# Patient Record
Sex: Female | Born: 1980 | Race: White | Hispanic: No | Marital: Single | Smoking: Current every day smoker
Health system: Southern US, Community
[De-identification: ages and names within clinical notes are randomized; demographics above are authoritative.]

## PROBLEM LIST (undated history)

## (undated) DIAGNOSIS — C801 Malignant (primary) neoplasm, unspecified: Secondary | ICD-10-CM

## (undated) DIAGNOSIS — Z905 Acquired absence of kidney: Secondary | ICD-10-CM

## (undated) DIAGNOSIS — N200 Calculus of kidney: Secondary | ICD-10-CM

## (undated) DIAGNOSIS — I10 Essential (primary) hypertension: Secondary | ICD-10-CM

## (undated) DIAGNOSIS — G43909 Migraine, unspecified, not intractable, without status migrainosus: Secondary | ICD-10-CM

## (undated) HISTORY — PX: NEPHRECTOMY: SHX65

## (undated) HISTORY — PX: LYMPH GLAND EXCISION: SHX13

---

## 2013-07-16 ENCOUNTER — Inpatient Hospital Stay (HOSPITAL_COMMUNITY): Payer: Self-pay

## 2013-07-16 ENCOUNTER — Emergency Department (HOSPITAL_COMMUNITY): Payer: Self-pay

## 2013-07-16 ENCOUNTER — Inpatient Hospital Stay (HOSPITAL_COMMUNITY)
Admission: EM | Admit: 2013-07-16 | Discharge: 2013-07-17 | DRG: 093 | Disposition: A | Discharge status: Home / Self Care | Payer: Self-pay | Attending: Neurology | Admitting: Neurology

## 2013-07-16 ENCOUNTER — Encounter (HOSPITAL_COMMUNITY): Payer: Self-pay | Admitting: Emergency Medicine

## 2013-07-16 ENCOUNTER — Emergency Department (HOSPITAL_COMMUNITY): Payer: MEDICAID

## 2013-07-16 DIAGNOSIS — I671 Cerebral aneurysm, nonruptured: Principal | ICD-10-CM | POA: Diagnosis present

## 2013-07-16 DIAGNOSIS — F17200 Nicotine dependence, unspecified, uncomplicated: Secondary | ICD-10-CM | POA: Diagnosis present

## 2013-07-16 DIAGNOSIS — I10 Essential (primary) hypertension: Secondary | ICD-10-CM | POA: Diagnosis present

## 2013-07-16 DIAGNOSIS — H02409 Unspecified ptosis of unspecified eyelid: Secondary | ICD-10-CM | POA: Diagnosis present

## 2013-07-16 DIAGNOSIS — R4789 Other speech disturbances: Secondary | ICD-10-CM | POA: Diagnosis present

## 2013-07-16 DIAGNOSIS — M6281 Muscle weakness (generalized): Secondary | ICD-10-CM

## 2013-07-16 DIAGNOSIS — I639 Cerebral infarction, unspecified: Secondary | ICD-10-CM | POA: Diagnosis present

## 2013-07-16 DIAGNOSIS — Z85528 Personal history of other malignant neoplasm of kidney: Secondary | ICD-10-CM

## 2013-07-16 DIAGNOSIS — R29898 Other symptoms and signs involving the musculoskeletal system: Secondary | ICD-10-CM | POA: Diagnosis present

## 2013-07-16 DIAGNOSIS — R55 Syncope and collapse: Secondary | ICD-10-CM

## 2013-07-16 DIAGNOSIS — R531 Weakness: Secondary | ICD-10-CM | POA: Diagnosis present

## 2013-07-16 DIAGNOSIS — E876 Hypokalemia: Secondary | ICD-10-CM | POA: Diagnosis present

## 2013-07-16 DIAGNOSIS — I6350 Cerebral infarction due to unspecified occlusion or stenosis of unspecified cerebral artery: Secondary | ICD-10-CM

## 2013-07-16 HISTORY — DX: Calculus of kidney: N20.0

## 2013-07-16 HISTORY — DX: Migraine, unspecified, not intractable, without status migrainosus: G43.909

## 2013-07-16 HISTORY — DX: Essential (primary) hypertension: I10

## 2013-07-16 HISTORY — DX: Malignant (primary) neoplasm, unspecified: C80.1

## 2013-07-16 HISTORY — DX: Acquired absence of kidney: Z90.5

## 2013-07-16 LAB — I-STAT CHEM 8, ED
BUN: 10 mg/dL (ref 6–23)
CREATININE: 0.7 mg/dL (ref 0.50–1.10)
Calcium, Ion: 1.22 mmol/L (ref 1.12–1.23)
Chloride: 104 mEq/L (ref 96–112)
Glucose, Bld: 75 mg/dL (ref 70–99)
HCT: 38 % (ref 36.0–46.0)
Hemoglobin: 12.9 g/dL (ref 12.0–15.0)
Potassium: 3.2 mEq/L — ABNORMAL LOW (ref 3.7–5.3)
Sodium: 143 mEq/L (ref 137–147)
TCO2: 22 mmol/L (ref 0–100)

## 2013-07-16 LAB — COMPREHENSIVE METABOLIC PANEL
ALBUMIN: 3.5 g/dL (ref 3.5–5.2)
ALK PHOS: 56 U/L (ref 39–117)
ALT: 7 U/L (ref 0–35)
AST: 11 U/L (ref 0–37)
BUN: 12 mg/dL (ref 6–23)
CALCIUM: 8.9 mg/dL (ref 8.4–10.5)
CO2: 23 mEq/L (ref 19–32)
Chloride: 106 mEq/L (ref 96–112)
Creatinine, Ser: 0.71 mg/dL (ref 0.50–1.10)
GFR calc non Af Amer: >90 mL/min (ref >90)
GLUCOSE: 75 mg/dL (ref 70–99)
POTASSIUM: 3.4 meq/L — AB (ref 3.7–5.3)
SODIUM: 142 meq/L (ref 137–147)
TOTAL PROTEIN: 6.1 g/dL (ref 6.0–8.3)
Total Bilirubin: 0.3 mg/dL (ref 0.3–1.2)

## 2013-07-16 LAB — CBC
HEMATOCRIT: 37 % (ref 36.0–46.0)
Hemoglobin: 12.4 g/dL (ref 12.0–15.0)
MCH: 31 pg (ref 26.0–34.0)
MCHC: 33.5 g/dL (ref 30.0–36.0)
MCV: 92.5 fL (ref 78.0–100.0)
PLATELETS: 231 10*3/uL (ref 150–400)
RBC: 4 MIL/uL (ref 3.87–5.11)
RDW: 12.6 % (ref 11.5–15.5)
WBC: 7.4 10*3/uL (ref 4.0–10.5)

## 2013-07-16 LAB — APTT: APTT: 38 s — AB (ref 24–37)

## 2013-07-16 LAB — URINALYSIS, ROUTINE W REFLEX MICROSCOPIC
Bilirubin Urine: NEGATIVE
GLUCOSE, UA: NEGATIVE mg/dL
KETONES UR: NEGATIVE mg/dL
Leukocytes, UA: NEGATIVE
Nitrite: NEGATIVE
PROTEIN: NEGATIVE mg/dL
Specific Gravity, Urine: 1.014 (ref 1.005–1.030)
UROBILINOGEN UA: 0.2 mg/dL (ref 0.0–1.0)
pH: 7.5 (ref 5.0–8.0)

## 2013-07-16 LAB — MRSA PCR SCREENING: MRSA by PCR: NEGATIVE

## 2013-07-16 LAB — DIFFERENTIAL
BASOS PCT: 0 % (ref 0–1)
Basophils Absolute: 0 10*3/uL (ref 0.0–0.1)
EOS ABS: 0.1 10*3/uL (ref 0.0–0.7)
EOS PCT: 2 % (ref 0–5)
Lymphocytes Relative: 33 % (ref 12–46)
Lymphs Abs: 2.5 10*3/uL (ref 0.7–4.0)
Monocytes Absolute: 0.5 10*3/uL (ref 0.1–1.0)
Monocytes Relative: 7 % (ref 3–12)
Neutro Abs: 4.3 10*3/uL (ref 1.7–7.7)
Neutrophils Relative %: 58 % (ref 43–77)

## 2013-07-16 LAB — PROTIME-INR
INR: 1.12 (ref 0.00–1.49)
Prothrombin Time: 14.2 seconds (ref 11.6–15.2)

## 2013-07-16 LAB — RAPID URINE DRUG SCREEN, HOSP PERFORMED
AMPHETAMINES: NOT DETECTED
BENZODIAZEPINES: NOT DETECTED
Barbiturates: NOT DETECTED
Cocaine: NOT DETECTED
Opiates: NOT DETECTED
TETRAHYDROCANNABINOL: NOT DETECTED

## 2013-07-16 LAB — CBG MONITORING, ED: Glucose-Capillary: 90 mg/dL (ref 70–99)

## 2013-07-16 LAB — POC URINE PREG, ED: PREG TEST UR: NEGATIVE

## 2013-07-16 LAB — ETHANOL: Alcohol, Ethyl (B): <11 mg/dL (ref 0–11)

## 2013-07-16 LAB — URINE MICROSCOPIC-ADD ON

## 2013-07-16 LAB — I-STAT TROPONIN, ED: Troponin i, poc: 0 ng/mL (ref 0.00–0.08)

## 2013-07-16 MED ORDER — LABETALOL HCL 5 MG/ML IV SOLN
10.0000 mg | Freq: Once | INTRAVENOUS | Status: AC
  Administered 2013-07-16: 10 mg via INTRAVENOUS

## 2013-07-16 MED ORDER — SODIUM CHLORIDE 0.9 % IV SOLN
INTRAVENOUS | Status: DC
  Administered 2013-07-16: 14:00:00 via INTRAVENOUS

## 2013-07-16 MED ORDER — ONDANSETRON HCL 4 MG/2ML IJ SOLN
4.0000 mg | Freq: Four times a day (QID) | INTRAMUSCULAR | Status: DC
  Administered 2013-07-16: 4 mg via INTRAVENOUS
  Filled 2013-07-16: qty 2

## 2013-07-16 MED ORDER — STUDY - INVESTIGATIONAL DRUG SIMPLE RECORD
325.0000 mg | Freq: Once | Status: AC
  Administered 2013-07-16: 325 mg via ORAL
  Filled 2013-07-16: qty 325

## 2013-07-16 MED ORDER — ACETAMINOPHEN 325 MG PO TABS
650.0000 mg | ORAL_TABLET | ORAL | Status: DC | PRN
  Administered 2013-07-16 – 2013-07-17 (×3): 650 mg via ORAL
  Filled 2013-07-16 (×3): qty 2

## 2013-07-16 MED ORDER — HYDROMORPHONE HCL PF 1 MG/ML IJ SOLN
2.0000 mg | Freq: Once | INTRAMUSCULAR | Status: AC
  Administered 2013-07-16: 2 mg via INTRAVENOUS
  Filled 2013-07-16: qty 2

## 2013-07-16 MED ORDER — DIPHENHYDRAMINE HCL 50 MG/ML IJ SOLN
12.5000 mg | Freq: Once | INTRAMUSCULAR | Status: AC
  Administered 2013-07-16: 12.5 mg via INTRAVENOUS
  Filled 2013-07-16: qty 1

## 2013-07-16 MED ORDER — ONDANSETRON HCL 4 MG/2ML IJ SOLN
4.0000 mg | Freq: Once | INTRAMUSCULAR | Status: AC
  Administered 2013-07-16: 4 mg via INTRAVENOUS
  Filled 2013-07-16: qty 2

## 2013-07-16 MED ORDER — ONDANSETRON HCL 4 MG/2ML IJ SOLN: 4.0000 mg | Freq: Four times a day (QID) | INTRAMUSCULAR | Status: DC | PRN

## 2013-07-16 MED ORDER — IOHEXOL 350 MG/ML SOLN
50.0000 mL | Freq: Once | INTRAVENOUS | Status: AC | PRN
  Administered 2013-07-16: 50 mL via INTRAVENOUS

## 2013-07-16 MED ORDER — DIPHENHYDRAMINE HCL 50 MG/ML IJ SOLN
25.0000 mg | Freq: Once | INTRAMUSCULAR | Status: AC
  Administered 2013-07-16: 25 mg via INTRAVENOUS
  Filled 2013-07-16: qty 1

## 2013-07-16 MED ORDER — STROKE: EARLY STAGES OF RECOVERY BOOK
Freq: Once | Status: DC
  Filled 2013-07-16: qty 1

## 2013-07-16 MED ORDER — HYDROMORPHONE HCL PF 1 MG/ML IJ SOLN
2.0000 mg | INTRAMUSCULAR | Status: DC | PRN
  Administered 2013-07-16 – 2013-07-17 (×6): 2 mg via INTRAVENOUS
  Filled 2013-07-16 (×6): qty 2

## 2013-07-16 MED ORDER — HYDROMORPHONE HCL PF 1 MG/ML IJ SOLN
INTRAMUSCULAR | Status: AC
  Filled 2013-07-16: qty 2

## 2013-07-16 MED ORDER — PANTOPRAZOLE SODIUM 40 MG IV SOLR
40.0000 mg | Freq: Every day | INTRAVENOUS | Status: DC
  Administered 2013-07-16: 40 mg via INTRAVENOUS
  Filled 2013-07-16 (×2): qty 40

## 2013-07-16 MED ORDER — LABETALOL HCL 5 MG/ML IV SOLN
10.0000 mg | INTRAVENOUS | Status: DC | PRN
Start: 2013-07-16 — End: 2013-07-17
  Administered 2013-07-16 – 2013-07-17 (×2): 10 mg via INTRAVENOUS
  Filled 2013-07-16 (×2): qty 4

## 2013-07-16 MED ORDER — ACETAMINOPHEN 650 MG RE SUPP: 650.0000 mg | RECTAL | Status: DC | PRN

## 2013-07-16 MED ORDER — STUDY - INVESTIGATIONAL DRUG SIMPLE RECORD
0.9000 mg/kg | Freq: Once | Status: AC
Start: 2013-07-16 — End: 2013-07-16
  Administered 2013-07-16: 50.31 mg via INTRAVENOUS
  Filled 2013-07-16: qty 50.31

## 2013-07-16 MED ORDER — LABETALOL HCL 5 MG/ML IV SOLN
INTRAVENOUS | Status: AC
  Filled 2013-07-16: qty 4

## 2013-07-16 NOTE — ED Notes (Signed)
Dr. Rancour at the bedside.  

## 2013-07-16 NOTE — ED Notes (Signed)
CBG of 90 completed

## 2013-07-16 NOTE — ED Notes (Signed)
Notified RN CBG 90

## 2013-07-16 NOTE — ED Provider Notes (Signed)
CSN: [id number]     Arrival date & time 07/16/13  0910 History   First MD Initiated Contact with Patient 07/16/13 [phone]     Chief Complaint  Patient presents with  . Code Stroke     (Consider location/radiation/quality/duration/timing/severity/associated sxs/prior Treatment) HPI Comments: Patient arrives as code stroke. 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 This was followed by episode of syncope. This is associated with gradual onset headache she had this morning. No history of headaches or migraines. Remote history of kidney cancer. Denies any chest pain, shortness of breath or abdominal pain. No difficulty swallowing.  The history is provided by the patient and the EMS personnel. The history is limited by the condition of the patient.    Past Medical History  Diagnosis Date  . Kidney stones   . Migraines   . Cancer     renal  . History of partial nephrectomy     renal ca; partial left nephrectomy '12  . Hypertension    Past Surgical History  Procedure Laterality Date  . Nephrectomy Left   . Cesarean section  x3  . Lymph gland excision Right    History reviewed. No pertinent family history. History  Substance Use Topics  . Smoking status: Current Every Day Smoker -- 0.50 packs/day    Types: Cigarettes  . Smokeless tobacco: Not on file  . Alcohol Use: Yes     Comment: occ   OB History   Grav Para Term Preterm Abortions TAB SAB Ect Mult Living                 Review of Systems  Unable to perform ROS: Acuity of condition      Allergies  Sulfa antibiotics  Home Medications   Prior to Admission medications   Not on File   BP 159/102  Pulse 82  Temp(Src) 98.2 F (36.8 C) (Oral)  Resp 21  Ht 5\' 3"  (1.6 m)  Wt 123 lb 3.2 oz (55.883 kg)  BMI 21.83 kg/m2  SpO2 96%  LMP 06/27/2013 Physical Exam  Constitutional: She is oriented to person, place, and  time. She appears well-developed and well-nourished. No distress.  HENT:  Head: Normocephalic and atraumatic.  Mouth/Throat: Oropharynx is clear and moist. No oropharyngeal exudate.  Eyes: Conjunctivae and EOM are normal. Pupils are equal, round, and reactive to light.  Neck: Normal range of motion. Neck supple.  Cardiovascular: Normal rate, regular rhythm and normal heart sounds.   No murmur heard. Pulmonary/Chest: Effort normal and breath sounds normal. No respiratory distress.  Abdominal: Soft. There is no tenderness. There is no rebound and no guarding.  Musculoskeletal: Normal range of motion. She exhibits no edema and no tenderness.  Neurological: She is alert and oriented to person, place, and time. A cranial nerve deficit is present. She exhibits normal muscle tone. Coordination normal.  L eye droop. Symmetrical smile Weakness in L arm with pronator drift. Drift of L leg Normal speech.  5/5 strength bilateral lower extremities  Skin: Skin is warm.    ED Course  Procedures (including critical care time) Labs Review Labs Reviewed  APTT - Abnormal; Notable for the following:    aPTT 38 (*)    All other components within normal limits  COMPREHENSIVE METABOLIC PANEL - Abnormal; Notable for the following:    Potassium 3.4 (*)    All other components within normal limits  URINALYSIS, ROUTINE  W REFLEX MICROSCOPIC - Abnormal; Notable for the following:    Hgb urine dipstick MODERATE (*)    All other components within normal limits  URINE MICROSCOPIC-ADD ON - Abnormal; Notable for the following:    Squamous Epithelial / LPF FEW (*)    Bacteria, UA FEW (*)    All other components within normal limits  I-STAT CHEM 8, ED - Abnormal; Notable for the following:    Potassium 3.2 (*)    All other components within normal limits  ETHANOL  PROTIME-INR  CBC  DIFFERENTIAL  URINE RAPID DRUG SCREEN (HOSP PERFORMED)  I-STAT TROPOININ, ED  I-STAT TROPOININ, ED  CBG MONITORING, ED  POC  URINE PREG, ED    Imaging Review Ct Angio Head W/cm &/or Wo Cm  07/16/2013   CLINICAL DATA:  33 year old female code stroke left side weakness. Initial encounter.  EXAM: CT ANGIOGRAPHY HEAD AND NECK  TECHNIQUE: Multidetector CT imaging of the head and neck was performed using the standard protocol during bolus administration of intravenous contrast. Multiplanar CT image reconstructions and MIPs were obtained to evaluate the vascular anatomy. Carotid stenosis measurements (when applicable) are obtained utilizing NASCET criteria, using the distal internal carotid diameter as the denominator.  CONTRAST:  45mL OMNIPAQUE IOHEXOL 350 MG/ML SOLN  COMPARISON:  Non contrast head CT 916 hr the same day.  FINDINGS: CTA HEAD FINDINGS  Stable gray-white matter differentiation throughout the brain. No evidence of cortically based acute infarction identified. No abnormal enhancement identified. No midline shift, mass effect, or evidence of intracranial mass lesion. No ventriculomegaly. Calvarium intact. Visualized scalp soft tissues are within normal limits.  VASCULAR FINDINGS:  Normal distal vertebral arteries, the left is mildly dominant. Patent left PICA origin. Dominant right AICA origin is patent. Normal vertebrobasilar junction. No basilar stenosis. Normal SCA and PCA origins. Diminutive bilateral posterior communicating arteries. Bilateral PCA branches are within normal limits.  Both ICA siphons are patent and normal. Normal ophthalmic and posterior communicating artery origins. Normal carotid termini. MCA and ACA origins are normal. Diminutive anterior communicating artery. Bilateral ACA branches are normal. Left MCA M1 segment and branches are within normal limits.  Right MCA M1 segment is patent without stenosis. 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 The right MCA bifurcation remains patent. Right MCA M2 No right MCA  branch occlusion is identified. .  Review of the MIP images confirms the above findings.  CTA NECK FINDINGS  Negative lung apices. No superior mediastinal lymphadenopathy. Negative thyroid, larynx, pharynx, parapharyngeal spaces, retropharyngeal space, sublingual space, submandibular gland, parotid glands, orbits soft tissues. Visualized paranasal sinuses and mastoids are clear. No cervical lymphadenopathy. No acute osseous abnormality identified.  VASCULAR FINDINGS:  The aortic arch does appear ectatic, but without atherosclerosis. Normal 3 vessel arch configuration. No great vessel origin stenosis.  Normal right CCA origin. Normal right CCA, right carotid bifurcation, right ICA origin, and cervical right ICA except for minimal tortuosity.  No proximal right subclavian artery stenosis. Normal right vertebral artery origin. Normal cervical right vertebral artery, minimally tortuous and slightly non dominant.  Normal left CCA origin. Normal left CCA, left carotid bifurcation and left ICA origin. Normal cervical left ICA except for mild tortuosity just below the skullbase.  No proximal left subclavian artery stenosis. Left vertebral artery origin mildly obscured by nearby a dense intravenous contrast, but appears widely patent. The proximal left V1 segment is tortuous but otherwise normal. The  left vertebral artery is mildly dominant and normal to the skullbase.  Review of the MIP images confirms the above findings.  IMPRESSION: 1. Negative neck CTA except for mild ectasia of the visualized thoracic aorta. 2. 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 No intracranial artery stenosis or major circle of Willis branch occlusion identified. 3. Stable and negative CT appearance of the brain.  Study discussed by telephone with Dr. Wallie Char on 07/16/2013 at 11:04 hours .   Electronically Signed   By: Lars Pinks M.D.   On: 07/16/2013 11:20   Ct Head Wo Contrast  07/16/2013   CLINICAL  DATA:  Code stroke.  Left-sided weakness.  EXAM: CT HEAD WITHOUT CONTRAST  TECHNIQUE: Contiguous axial images were obtained from the base of the skull through the vertex without contrast.  COMPARISON:  None  FINDINGS: Normal appearance of the intracranial structures. No evidence for acute hemorrhage, mass lesion, midline shift, hydrocephalus or large infarct. No acute bony abnormality. The visualized sinuses are clear.  IMPRESSION: Negative exam.  Critical Value/emergent results were called by telephone at the time of interpretation on 07/16/2013 at 9:19 AM to Dr. Wallie Char Who verbally acknowledged these results.   Electronically Signed   By: Rolla Flatten M.D.   On: 07/16/2013 09:24   Ct Angio Neck W/cm &/or Wo/cm  07/16/2013   CLINICAL DATA:  34 year old female code stroke left side weakness. Initial encounter.  EXAM: CT ANGIOGRAPHY HEAD AND NECK  TECHNIQUE: Multidetector CT imaging of the head and neck was performed using the standard protocol during bolus administration of intravenous contrast. Multiplanar CT image reconstructions and MIPs were obtained to evaluate the vascular anatomy. Carotid stenosis measurements (when applicable) are obtained utilizing NASCET criteria, using the distal internal carotid diameter as the denominator.  CONTRAST:  53mL OMNIPAQUE IOHEXOL 350 MG/ML SOLN  COMPARISON:  Non contrast head CT 916 hr the same day.  FINDINGS: CTA HEAD FINDINGS  Stable gray-white matter differentiation throughout the brain. No evidence of cortically based acute infarction identified. No abnormal enhancement identified. No midline shift, mass effect, or evidence of intracranial mass lesion. No ventriculomegaly. Calvarium intact. Visualized scalp soft tissues are within normal limits.  VASCULAR FINDINGS:  Normal distal vertebral arteries, the left is mildly dominant. Patent left PICA origin. Dominant right AICA origin is patent. Normal vertebrobasilar junction. No basilar stenosis. Normal SCA and PCA  origins. Diminutive bilateral posterior communicating arteries. Bilateral PCA branches are within normal limits.  Both ICA siphons are patent and normal. Normal ophthalmic and posterior communicating artery origins. Normal carotid termini. MCA and ACA origins are normal. Diminutive anterior communicating artery. Bilateral ACA branches are normal. Left MCA M1 segment and branches are within normal limits.  Right MCA M1 segment is patent without stenosis. 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 The right MCA bifurcation remains patent. Right MCA M2 No right MCA branch occlusion is identified. .  Review of the MIP images confirms the above findings.  CTA NECK FINDINGS  Negative lung apices. No superior mediastinal lymphadenopathy. Negative thyroid, larynx, pharynx, parapharyngeal spaces, retropharyngeal space, sublingual space, submandibular gland, parotid glands, orbits soft tissues. Visualized paranasal sinuses and mastoids are clear. No cervical lymphadenopathy. No acute osseous abnormality identified.  VASCULAR FINDINGS:  The aortic arch does appear ectatic, but without atherosclerosis. Normal 3 vessel arch configuration. No great vessel origin stenosis.  Normal right CCA origin. Normal  right CCA, right carotid bifurcation, right ICA origin, and cervical right ICA except for minimal tortuosity.  No proximal right subclavian artery stenosis. Normal right vertebral artery origin. Normal cervical right vertebral artery, minimally tortuous and slightly non dominant.  Normal left CCA origin. Normal left CCA, left carotid bifurcation and left ICA origin. Normal cervical left ICA except for mild tortuosity just below the skullbase.  No proximal left subclavian artery stenosis. Left vertebral artery origin mildly obscured by nearby a dense intravenous contrast, but appears widely patent. The proximal left V1 segment is tortuous but  otherwise normal. The left vertebral artery is mildly dominant and normal to the skullbase.  Review of the MIP images confirms the above findings.  IMPRESSION: 1. Negative neck CTA except for mild ectasia of the visualized thoracic aorta. 2. 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 No intracranial artery stenosis or major circle of Willis branch occlusion identified. 3. Stable and negative CT appearance of the brain.  Study discussed by telephone with Dr. Wallie Char on 07/16/2013 at 11:04 hours .   Electronically Signed   By: Lars Pinks M.D.   On: 07/16/2013 11:20     EKG Interpretation   Date/Time:  Thursday July 16 2013 09:23:21 EDT Ventricular Rate:  74 PR Interval:  165 QRS Duration: 88 QT Interval:  407 QTC Calculation: 451 R Axis:   73 Text Interpretation:  Sinus rhythm LVH by voltage Nonspecific T abnrm,  anterolateral leads No previous ECGs available Confirmed by Wyvonnia Dusky  MD,  Sammuel Blick [phone]) on 07/16/2013 9:31:35 AM      MDM   Final diagnoses:  CVA (cerebral infarction)   Episode of dizziness with difficulty speaking and weakness in her left side that onset while she was working. This was followed by episode of syncope. Patient denies sudden onset of headache before syncope. She had headache preceding her symptoms. Code stroke called by EMS. No seizure acitvity.  CT head negative for hemorrhage. Patient with left-sided weakness and pronator drift on the left arm.  Seen at bedside with Dr. Nicole Kindred of neurology. Patient with an NIH scale of 2.  She is not a candidate for TPA given her low stroke scale.   Neurology has enrolled patient in research trial and may be randomized to receive tPA per Dr. Leonie Man.   Ezequiel Essex, MD 07/16/13 [phone]

## 2013-07-16 NOTE — ED Notes (Signed)
Per [facility], pt from kidney center, work, for code stroke. States she was dizzy as she was fixing an iv on a pt and has had a HA since 0300. She started having some slurred speech and fell back hitting her head on the floor and was unconscious for about 15 sec. Has an 18g to [place]. Pt has weakness on the left side and states it feels heavy. Upon EMS arrival pt did not have any slurred speech. A/O x 4.

## 2013-07-16 NOTE — Progress Notes (Signed)
UR completed.  Mirl Hillery, RN BSN MHA CCM Trauma/Neuro ICU Case Manager [phone]  

## 2013-07-16 NOTE — Code Documentation (Addendum)
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 She leaned over to adjust the IV of a pt, and developed sudden onset of dizziness at 0800. She also noted slurred speech, and then her colleagues report her falling backwards striking her head on the floor. She has not recollection of falling. She reports that she awoke at 0300 with a headache, but no focal deficits. EMS was called and they activated code stroke at 0903. On arrival at 0910, labs were drawn and she was cleared for CT which showed not acute abnormality. NIHSS was done with score 3: 1 each for LUE, LLE, and decreased sensation on L. She does not split the midline for sharp, but she does for temperature (hot and cold). There is a very slight ptosis on the L.. She also still c/o dizziness. No nystagmus noted. Pt reports h/o renal cancer with partial nephrectomy 4-5 years ago. Her  BP is 180-200/100; she reports her physicians have not treated her HTN to avoid decreased perfusion to kidneys. Her Cr=0.7. BP treated with labetalol. Pt offered enrollment in the PRISMS trial for "too mild to treat" strokes symptoms.. She is interested. No exclusions noted. Dr. Leonie Man and research nurse contacted. CTA requested and completed (see report). Study "drug" hung at 1138. VS and neuro cks to be monitored as if pt received tPA. Handoff done with ER RN.

## 2013-07-16 NOTE — ED Notes (Addendum)
Trial drug and infusion rate verified by this RN

## 2013-07-16 NOTE — ED Notes (Signed)
Dr Stewart at bedside.

## 2013-07-16 NOTE — Progress Notes (Signed)
Pt c/o nausea after administering 2mg  Dilaudid.  Dr. Leonel Ramsay called, notified. Orders received. Will monitor HA for possible CT follow up

## 2013-07-16 NOTE — ED Notes (Signed)
Attempted to do swallow screen but pt's dizziness worsens when she sits up.

## 2013-07-16 NOTE — ED Notes (Signed)
Dr. Leonie Man at the bedside.

## 2013-07-16 NOTE — Consult Note (Deleted)
Referring Physician: Dr. Wyvonnia Dusky     Chief Complaint: Brief loss of consciousness, slurred speech and left-sided weakness.  HPI: Brandy Galvan is an 33 y.o. female with a history of kidney stones presenting following an episode of brief loss of consciousness while at work. She noticed lightheadedness and slurred speech prior to losing consciousness and slurred speech and persisted on waking up. She also noted left-sided weakness involving upper and lower extremities. Weakness was noted. History stroke or TIA. She had been on antiplatelet therapy. CT scan of the head showed no acute intracranial abnormality. NIH stroke score was 2 with drift of left extremities. Patient was not deemed a candidate for TPA because of the minimal deficits on exam. Slurred speech resolved prior to arriving in the emergency room. Patient was unconscious for only a few seconds and no seizure activity was reported.  LSN: 8 AM on 07/16/2013 tPA Given: No: Minimal deficits and NIH stroke score 2 MRankin: 1  Past Medical History  Diagnosis Date  . Kidney stones     No family history on file.   Medications: I have reviewed the patient's current medications.  ROS: History obtained from the patient  General ROS: negative for - chills, fatigue, fever, night sweats, weight gain or weight loss Psychological ROS: negative for - behavioral disorder, hallucinations, memory difficulties, mood swings or suicidal ideation Ophthalmic ROS: negative for - blurry vision, double vision, eye pain or loss of vision ENT ROS: negative for - epistaxis, nasal discharge, oral lesions, sore throat, tinnitus or vertigo Allergy and Immunology ROS: negative for - hives or itchy/watery eyes Hematological and Lymphatic ROS: negative for - bleeding problems, bruising or swollen lymph nodes Endocrine ROS: negative for - galactorrhea, hair pattern changes, polydipsia/polyuria or temperature intolerance Respiratory ROS: negative for - cough,  hemoptysis, shortness of breath or wheezing Cardiovascular ROS: negative for - chest pain, dyspnea on exertion, edema or irregular heartbeat Gastrointestinal ROS: negative for - abdominal pain, diarrhea, hematemesis, nausea/vomiting or stool incontinence Genito-Urinary ROS: negative for - dysuria, hematuria, incontinence or urinary frequency/urgency Musculoskeletal ROS: negative for - joint swelling or muscular weakness Neurological ROS: as noted in HPI Dermatological ROS: negative for rash and skin lesion changes  Physical Examination: Blood pressure 176/120, temperature 98.7 F (37.1 C), temperature source Oral, resp. rate 18, height 5\' 3"  (1.6 m), weight 55.883 kg (123 lb 3.2 oz), last menstrual period 06/27/2013, SpO2 100.00%.  Neurologic Examination: Mental Status: Alert, oriented, thought content appropriate.  Speech fluent without evidence of aphasia. Able to follow commands without difficulty. Cranial Nerves: II-Visual fields were normal. III/IV/VI-Pupils were equal and reacted. Extraocular movements were full and conjugate.    V/VII-no facial numbness and no facial weakness. VIII-normal. X-normal speech. Motor: Mild drift of left upper and lower extremities; otherwise unremarkable motor exam Sensory: Normal throughout. Deep Tendon Reflexes: 2+ and symmetric. Plantars: Flexor bilaterally Cerebellar: Normal finger-to-nose testing.  Ct Head Wo Contrast  07/16/2013   CLINICAL DATA:  Code stroke.  Left-sided weakness.  EXAM: CT HEAD WITHOUT CONTRAST  TECHNIQUE: Contiguous axial images were obtained from the base of the skull through the vertex without contrast.  COMPARISON:  None  FINDINGS: Normal appearance of the intracranial structures. No evidence for acute hemorrhage, mass lesion, midline shift, hydrocephalus or large infarct. No acute bony abnormality. The visualized sinuses are clear.  IMPRESSION: Negative exam.  Critical Value/emergent results were called by telephone at the  time of interpretation on 07/16/2013 at 9:19 AM to Dr. Wallie Char Who verbally acknowledged these results.  Electronically Signed   By: Rolla Flatten M.D.   On: 07/16/2013 09:24    Assessment: 33 y.o. female presenting following an episode of loss of consciousness, most likely secondary to syncope, as well as persistent mild weakness of left upper and lower extremities. Slurred speech resolved. Right subcortical TIA or small stroke unlikely, but cannot be ruled out.  Stroke Risk Factors - family history and smoking  Plan: 1. HgbA1c, fasting lipid panel 2. MRI, MRA  of the brain without contrast 3. PT consult, OT consult, Speech consult 4. Echocardiogram 5. Carotid dopplers 6. Prophylactic therapy-Antiplatelet med: Aspirin 81 mg per day 7. Studies to rule out hypercoagulable state 8. EEG, routine adult study   C.R. Nicole Kindred, MD [facility] [phone]  07/16/2013, 9:31 AM

## 2013-07-16 NOTE — H&P (Addendum)
Admission H&P    Chief Complaint: Episode of loss of consciousness with associated slurred speech and left-sided weakness.  HPI: Shantea Poulton is an 33 y.o. female with a history of kidney stones presenting following an episode of brief loss of consciousness while at work. She noticed lightheadedness and slurred speech prior to losing consciousness and slurred speech and persisted on waking up. She also noted left-sided weakness involving upper and lower extremities. Weakness was noted. History stroke or TIA. She had been on antiplatelet therapy. CT scan of the head showed no acute intracranial abnormality. NIH stroke score was 2 with drift of left extremities. Patient was not deemed a candidate for TPA because of the minimal deficits on exam. Slurred speech resolved prior to arriving in the emergency room. Patient was unconscious for only a few seconds and no seizure activity was reported. Mild left eyelid ptosis was noted. 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 CT angiogram of the neck showed ectatic appearance of the aortic arch. There was no great vessel stenosis.   LSN: 8 AM on 07/16/2013  tPA Given: No: Minimal deficits and NIH stroke score 2  MRankin: 1   Past Medical History  Diagnosis Date  . Kidney stones   . Migraines   . Cancer     renal  . History of partial nephrectomy     renal ca; partial left nephrectomy '12  . Hypertension     Past Surgical History  Procedure Laterality Date  . Nephrectomy Left   . Cesarean section  x3  . Lymph gland excision Right     History reviewed. No pertinent family history. Social History:  reports that she has been smoking Cigarettes.  She has been smoking about 0.50 packs per day. She does not have any smokeless tobacco history on file. She reports that she drinks alcohol. She reports that she does not use illicit drugs.  Allergies:  Allergies  Allergen Reactions  . Sulfa Antibiotics  Nausea And Vomiting     (Not in a hospital admission)  ROS: History obtained from the patient  General ROS: negative for - chills, fatigue, fever, night sweats, weight gain or weight loss Psychological ROS: negative for - behavioral disorder, hallucinations, memory difficulties, mood swings or suicidal ideation Ophthalmic ROS: negative for - blurry vision, double vision, eye pain or loss of vision ENT ROS: negative for - epistaxis, nasal discharge, oral lesions, sore throat, tinnitus or vertigo Allergy and Immunology ROS: negative for - hives or itchy/watery eyes Hematological and Lymphatic ROS: negative for - bleeding problems, bruising or swollen lymph nodes Endocrine ROS: negative for - galactorrhea, hair pattern changes, polydipsia/polyuria or temperature intolerance Respiratory ROS: negative for - cough, hemoptysis, shortness of breath or wheezing Cardiovascular ROS: negative for - chest pain, dyspnea on exertion, edema or irregular heartbeat Gastrointestinal ROS: negative for - abdominal pain, diarrhea, hematemesis, nausea/vomiting or stool incontinence Genito-Urinary ROS: negative for - dysuria, hematuria, incontinence or urinary frequency/urgency Musculoskeletal ROS: negative for - joint swelling or muscular weakness Neurological ROS: as noted in HPI Dermatological ROS: negative for rash and skin lesion changes  Physical Examination: Blood pressure 152/92, pulse 78, temperature 98.2 F (36.8 C), temperature source Oral, resp. rate 19, height $RemoveBe'5\' 3"'DXFOKiATo$  (1.6 m), weight 55.883 kg (123 lb 3.2 oz), last menstrual period 06/27/2013, SpO2 97.00%.  HEENT-  Normocephalic, no lesions, without obvious abnormality.  Normal external eye and conjunctiva.  Normal TM's bilaterally.  Normal auditory canals and  external ears. Normal external nose, mucus membranes and septum.  Normal pharynx. Neck supple with no masses, nodes, nodules or enlargement. Cardiovascular - regular rate and rhythm, S1, S2  normal, no murmur, click, rub or gallop Lungs - chest clear, no wheezing, rales, normal symmetric air entry, Heart exam - S1, S2 normal, no murmur, no gallop, rate regular Abdomen - soft, non-tender; bowel sounds normal; no masses,  no organomegaly Extremities - no joint deformities, effusion, or inflammation, no edema and no skin discoloration  Neurologic Examination: Mental Status:  Alert, oriented, thought content appropriate. Speech fluent without evidence of aphasia. Able to follow commands without difficulty.  Cranial Nerves:  II-Visual fields were normal.  III/IV/VI-Pupils were equal and reacted. Extraocular movements were full and conjugate; mild left eyelid ptosis noted.  V/VII-no facial numbness and no facial weakness.  VIII-normal.  X-normal speech.  Motor: Mild drift of left upper and lower extremities; otherwise unremarkable motor exam  Sensory: Normal throughout.  Deep Tendon Reflexes: 2+ and symmetric.  Plantars: Flexor bilaterally  Cerebellar: Normal finger-to-nose testing.   Results for orders placed during the hospital encounter of 07/16/13 (from the past 48 hour(s))  ETHANOL     Status: None   Collection Time    07/16/13  9:12 AM      Result Value Ref Range   Alcohol, Ethyl (B) <11  0 - 11 mg/dL   Comment:            LOWEST DETECTABLE LIMIT FOR     SERUM ALCOHOL IS 11 mg/dL     FOR MEDICAL PURPOSES ONLY  PROTIME-INR     Status: None   Collection Time    07/16/13  9:12 AM      Result Value Ref Range   Prothrombin Time 14.2  11.6 - 15.2 seconds   INR 1.12  0.00 - 1.49  APTT     Status: Abnormal   Collection Time    07/16/13  9:12 AM      Result Value Ref Range   aPTT 38 (*) 24 - 37 seconds   Comment:            IF BASELINE aPTT IS ELEVATED,     SUGGEST PATIENT RISK ASSESSMENT     BE USED TO DETERMINE APPROPRIATE     ANTICOAGULANT THERAPY.  CBC     Status: None   Collection Time    07/16/13  9:12 AM      Result Value Ref Range   WBC 7.4  4.0 - 10.5  K/uL   RBC 4.00  3.87 - 5.11 MIL/uL   Hemoglobin 12.4  12.0 - 15.0 g/dL   HCT 37.0  36.0 - 46.0 %   MCV 92.5  78.0 - 100.0 fL   MCH 31.0  26.0 - 34.0 pg   MCHC 33.5  30.0 - 36.0 g/dL   RDW 12.6  11.5 - 15.5 %   Platelets 231  150 - 400 K/uL  DIFFERENTIAL     Status: None   Collection Time    07/16/13  9:12 AM      Result Value Ref Range   Neutrophils Relative % 58  43 - 77 %   Neutro Abs 4.3  1.7 - 7.7 K/uL   Lymphocytes Relative 33  12 - 46 %   Lymphs Abs 2.5  0.7 - 4.0 K/uL   Monocytes Relative 7  3 - 12 %   Monocytes Absolute 0.5  0.1 - 1.0 K/uL   Eosinophils Relative  2  0 - 5 %   Eosinophils Absolute 0.1  0.0 - 0.7 K/uL   Basophils Relative 0  0 - 1 %   Basophils Absolute 0.0  0.0 - 0.1 K/uL  COMPREHENSIVE METABOLIC PANEL     Status: Abnormal   Collection Time    07/16/13  9:12 AM      Result Value Ref Range   Sodium 142  137 - 147 mEq/L   Potassium 3.4 (*) 3.7 - 5.3 mEq/L   Chloride 106  96 - 112 mEq/L   CO2 23  19 - 32 mEq/L   Glucose, Bld 75  70 - 99 mg/dL   BUN 12  6 - 23 mg/dL   Creatinine, Ser 2.74  0.50 - 1.10 mg/dL   Calcium 8.9  8.4 - 90.1 mg/dL   Total Protein 6.1  6.0 - 8.3 g/dL   Albumin 3.5  3.5 - 5.2 g/dL   AST 11  0 - 37 U/L   ALT 7  0 - 35 U/L   Alkaline Phosphatase 56  39 - 117 U/L   Total Bilirubin 0.3  0.3 - 1.2 mg/dL   GFR calc non Af Amer >90  >90 mL/min   GFR calc Af Amer >90  >90 mL/min   Comment: (NOTE)     The eGFR has been calculated using the CKD EPI equation.     This calculation has not been validated in all clinical situations.     eGFR's persistently <90 mL/min signify possible Chronic Kidney     Disease.  Rosezena Sensor, ED     Status: None   Collection Time    07/16/13  9:17 AM      Result Value Ref Range   Troponin i, poc 0.00  0.00 - 0.08 ng/mL   Comment 3            Comment: Due to the release kinetics of cTnI,     a negative result within the first hours     of the onset of symptoms does not rule out     myocardial  infarction with certainty.     If myocardial infarction is still suspected,     repeat the test at appropriate intervals.  I-STAT CHEM 8, ED     Status: Abnormal   Collection Time    07/16/13  9:19 AM      Result Value Ref Range   Sodium 143  137 - 147 mEq/L   Potassium 3.2 (*) 3.7 - 5.3 mEq/L   Chloride 104  96 - 112 mEq/L   BUN 10  6 - 23 mg/dL   Creatinine, Ser 0.99  0.50 - 1.10 mg/dL   Glucose, Bld 75  70 - 99 mg/dL   Calcium, Ion 6.55  5.73 - 1.23 mmol/L   TCO2 22  0 - 100 mmol/L   Hemoglobin 12.9  12.0 - 15.0 g/dL   HCT 88.9  36.8 - 30.5 %  CBG MONITORING, ED     Status: None   Collection Time    07/16/13  9:31 AM      Result Value Ref Range   Glucose-Capillary 90  70 - 99 mg/dL   Comment 1 Notify RN    URINE RAPID DRUG SCREEN (HOSP PERFORMED)     Status: None   Collection Time    07/16/13 10:15 AM      Result Value Ref Range   Opiates NONE DETECTED  NONE DETECTED   Cocaine NONE DETECTED  NONE DETECTED   Benzodiazepines NONE DETECTED  NONE DETECTED   Amphetamines NONE DETECTED  NONE DETECTED   Tetrahydrocannabinol NONE DETECTED  NONE DETECTED   Barbiturates NONE DETECTED  NONE DETECTED   Comment:            DRUG SCREEN FOR MEDICAL PURPOSES     ONLY.  IF CONFIRMATION IS NEEDED     FOR ANY PURPOSE, NOTIFY LAB     WITHIN 5 DAYS.                LOWEST DETECTABLE LIMITS     FOR URINE DRUG SCREEN     Drug Class       Cutoff (ng/mL)     Amphetamine      1000     Barbiturate      200     Benzodiazepine   128     Tricyclics       786     Opiates          300     Cocaine          300     THC              50  URINALYSIS, ROUTINE W REFLEX MICROSCOPIC     Status: Abnormal   Collection Time    07/16/13 10:15 AM      Result Value Ref Range   Color, Urine YELLOW  YELLOW   APPearance CLEAR  CLEAR   Specific Gravity, Urine 1.014  1.005 - 1.030   pH 7.5  5.0 - 8.0   Glucose, UA NEGATIVE  NEGATIVE mg/dL   Hgb urine dipstick MODERATE (*) NEGATIVE   Bilirubin Urine NEGATIVE   NEGATIVE   Ketones, ur NEGATIVE  NEGATIVE mg/dL   Protein, ur NEGATIVE  NEGATIVE mg/dL   Urobilinogen, UA 0.2  0.0 - 1.0 mg/dL   Nitrite NEGATIVE  NEGATIVE   Leukocytes, UA NEGATIVE  NEGATIVE  URINE MICROSCOPIC-ADD ON     Status: Abnormal   Collection Time    07/16/13 10:15 AM      Result Value Ref Range   Squamous Epithelial / LPF FEW (*) RARE   WBC, UA 0-2  <3 WBC/hpf   RBC / HPF 0-2  <3 RBC/hpf   Bacteria, UA FEW (*) RARE  POC URINE PREG, ED     Status: None   Collection Time    07/16/13 10:41 AM      Result Value Ref Range   Preg Test, Ur NEGATIVE  NEGATIVE   Comment:            THE SENSITIVITY OF THIS     METHODOLOGY IS >24 mIU/mL   Ct Angio Head W/cm &/or Wo Cm  07/16/2013   CLINICAL DATA:  33 year old female code stroke left side weakness. Initial encounter.  EXAM: CT ANGIOGRAPHY HEAD AND NECK  TECHNIQUE: Multidetector CT imaging of the head and neck was performed using the standard protocol during bolus administration of intravenous contrast. Multiplanar CT image reconstructions and MIPs were obtained to evaluate the vascular anatomy. Carotid stenosis measurements (when applicable) are obtained utilizing NASCET criteria, using the distal internal carotid diameter as the denominator.  CONTRAST:  28mL OMNIPAQUE IOHEXOL 350 MG/ML SOLN  COMPARISON:  Non contrast head CT 916 hr the same day.  FINDINGS: CTA HEAD FINDINGS  Stable gray-white matter differentiation throughout the brain. No evidence of cortically based acute infarction identified. No abnormal enhancement identified. No midline shift, mass effect, or evidence  of intracranial mass lesion. No ventriculomegaly. Calvarium intact. Visualized scalp soft tissues are within normal limits.  VASCULAR FINDINGS:  Normal distal vertebral arteries, the left is mildly dominant. Patent left PICA origin. Dominant right AICA origin is patent. Normal vertebrobasilar junction. No basilar stenosis. Normal SCA and PCA origins. Diminutive bilateral  posterior communicating arteries. Bilateral PCA branches are within normal limits.  Both ICA siphons are patent and normal. Normal ophthalmic and posterior communicating artery origins. Normal carotid termini. MCA and ACA origins are normal. Diminutive anterior communicating artery. Bilateral ACA branches are normal. Left MCA M1 segment and branches are within normal limits.  Right MCA M1 segment is patent without stenosis. 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 The right MCA bifurcation remains patent. Right MCA M2 No right MCA branch occlusion is identified. .  Review of the MIP images confirms the above findings.  CTA NECK FINDINGS  Negative lung apices. No superior mediastinal lymphadenopathy. Negative thyroid, larynx, pharynx, parapharyngeal spaces, retropharyngeal space, sublingual space, submandibular gland, parotid glands, orbits soft tissues. Visualized paranasal sinuses and mastoids are clear. No cervical lymphadenopathy. No acute osseous abnormality identified.  VASCULAR FINDINGS:  The aortic arch does appear ectatic, but without atherosclerosis. Normal 3 vessel arch configuration. No great vessel origin stenosis.  Normal right CCA origin. Normal right CCA, right carotid bifurcation, right ICA origin, and cervical right ICA except for minimal tortuosity.  No proximal right subclavian artery stenosis. Normal right vertebral artery origin. Normal cervical right vertebral artery, minimally tortuous and slightly non dominant.  Normal left CCA origin. Normal left CCA, left carotid bifurcation and left ICA origin. Normal cervical left ICA except for mild tortuosity just below the skullbase.  No proximal left subclavian artery stenosis. Left vertebral artery origin mildly obscured by nearby a dense intravenous contrast, but appears widely patent. The proximal left V1 segment is tortuous but otherwise normal. The left  vertebral artery is mildly dominant and normal to the skullbase.  Review of the MIP images confirms the above findings.  IMPRESSION: 1. Negative neck CTA except for mild ectasia of the visualized thoracic aorta. 2. 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 No intracranial artery stenosis or major circle of Willis branch occlusion identified. 3. Stable and negative CT appearance of the brain.  Study discussed by telephone with Dr. Wallie Char on 07/16/2013 at 11:04 hours .   Electronically Signed   By: Lars Pinks M.D.   On: 07/16/2013 11:20   Ct Head Wo Contrast  07/16/2013   CLINICAL DATA:  Code stroke.  Left-sided weakness.  EXAM: CT HEAD WITHOUT CONTRAST  TECHNIQUE: Contiguous axial images were obtained from the base of the skull through the vertex without contrast.  COMPARISON:  None  FINDINGS: Normal appearance of the intracranial structures. No evidence for acute hemorrhage, mass lesion, midline shift, hydrocephalus or large infarct. No acute bony abnormality. The visualized sinuses are clear.  IMPRESSION: Negative exam.  Critical Value/emergent results were called by telephone at the time of interpretation on 07/16/2013 at 9:19 AM to Dr. Wallie Char Who verbally acknowledged these results.   Electronically Signed   By: Rolla Flatten M.D.   On: 07/16/2013 09:24   Ct Angio Neck W/cm &/or Wo/cm  07/16/2013   CLINICAL DATA:  33 year old female code stroke left side weakness. Initial encounter.  EXAM: CT ANGIOGRAPHY HEAD AND NECK  TECHNIQUE: Multidetector CT imaging of the  head and neck was performed using the standard protocol during bolus administration of intravenous contrast. Multiplanar CT image reconstructions and MIPs were obtained to evaluate the vascular anatomy. Carotid stenosis measurements (when applicable) are obtained utilizing NASCET criteria, using the distal internal carotid diameter as the denominator.  CONTRAST:  50mL OMNIPAQUE IOHEXOL 350 MG/ML SOLN   COMPARISON:  Non contrast head CT 916 hr the same day.  FINDINGS: CTA HEAD FINDINGS  Stable gray-white matter differentiation throughout the brain. No evidence of cortically based acute infarction identified. No abnormal enhancement identified. No midline shift, mass effect, or evidence of intracranial mass lesion. No ventriculomegaly. Calvarium intact. Visualized scalp soft tissues are within normal limits.  VASCULAR FINDINGS:  Normal distal vertebral arteries, the left is mildly dominant. Patent left PICA origin. Dominant right AICA origin is patent. Normal vertebrobasilar junction. No basilar stenosis. Normal SCA and PCA origins. Diminutive bilateral posterior communicating arteries. Bilateral PCA branches are within normal limits.  Both ICA siphons are patent and normal. Normal ophthalmic and posterior communicating artery origins. Normal carotid termini. MCA and ACA origins are normal. Diminutive anterior communicating artery. Bilateral ACA branches are normal. Left MCA M1 segment and branches are within normal limits.  Right MCA M1 segment is patent without stenosis. 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 The right MCA bifurcation remains patent. Right MCA M2 No right MCA branch occlusion is identified. .  Review of the MIP images confirms the above findings.  CTA NECK FINDINGS  Negative lung apices. No superior mediastinal lymphadenopathy. Negative thyroid, larynx, pharynx, parapharyngeal spaces, retropharyngeal space, sublingual space, submandibular gland, parotid glands, orbits soft tissues. Visualized paranasal sinuses and mastoids are clear. No cervical lymphadenopathy. No acute osseous abnormality identified.  VASCULAR FINDINGS:  The aortic arch does appear ectatic, but without atherosclerosis. Normal 3 vessel arch configuration. No great vessel origin stenosis.  Normal right CCA origin. Normal right CCA, right  carotid bifurcation, right ICA origin, and cervical right ICA except for minimal tortuosity.  No proximal right subclavian artery stenosis. Normal right vertebral artery origin. Normal cervical right vertebral artery, minimally tortuous and slightly non dominant.  Normal left CCA origin. Normal left CCA, left carotid bifurcation and left ICA origin. Normal cervical left ICA except for mild tortuosity just below the skullbase.  No proximal left subclavian artery stenosis. Left vertebral artery origin mildly obscured by nearby a dense intravenous contrast, but appears widely patent. The proximal left V1 segment is tortuous but otherwise normal. The left vertebral artery is mildly dominant and normal to the skullbase.  Review of the MIP images confirms the above findings.  IMPRESSION: 1. Negative neck CTA except for mild ectasia of the visualized thoracic aorta. 2. 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 No intracranial artery stenosis or major circle of Willis branch occlusion identified. 3. Stable and negative CT appearance of the brain.  Study discussed by telephone with Dr. Wallie Char on 07/16/2013 at 11:04 hours .   Electronically Signed   By: Lars Pinks M.D.   On: 07/16/2013 11:20    Assessment: 33 y.o. female presenting following an episode of loss of consciousness, most likely secondary to syncope, as well as persistent mild weakness of left upper and lower extremities. Slurred speech resolved. Right subcortical TIA or small stroke unlikely, but cannot be ruled out.   Patient has agreed to participate in the PRISMS research treatment trial, and  will be receiving IV treatment which may be tPA, depending on randomization results.  Stroke Risk Factors - family history and smoking  Plan: 1. Post TPA management protocol with admission to neuro intensive care unit 2. MRI, MRA  of the brain without contrast 3. PT consult, OT consult, Speech consult 4.  Echocardiogram 5. Carotid dopplers 6. Prophylactic therapy-Antiplatelet med: Aspirin and if CT scan 24 hours post TPA/placebo infusion shows no sign of intracranial hemorrhage  7. Studies to rule out hypercoagulable state 8. EEG, routine adult study 9. HgbA1c, fasting lipid panel   C.R. Nicole Kindred, MD [facility] [phone]  07/16/2013, 11:40 AM

## 2013-07-16 NOTE — ED Notes (Signed)
Dr. Rancour back at the bedside.  

## 2013-07-16 NOTE — ED Notes (Signed)
Transport to CT 2 for CTA

## 2013-07-17 ENCOUNTER — Inpatient Hospital Stay (HOSPITAL_COMMUNITY): Payer: Self-pay

## 2013-07-17 DIAGNOSIS — R531 Weakness: Secondary | ICD-10-CM

## 2013-07-17 DIAGNOSIS — R55 Syncope and collapse: Secondary | ICD-10-CM

## 2013-07-17 DIAGNOSIS — I359 Nonrheumatic aortic valve disorder, unspecified: Secondary | ICD-10-CM

## 2013-07-17 DIAGNOSIS — I671 Cerebral aneurysm, nonruptured: Secondary | ICD-10-CM

## 2013-07-17 LAB — LIPID PANEL
Cholesterol: 89 mg/dL (ref 0–200)
HDL: 28 mg/dL — ABNORMAL LOW (ref >39)
LDL Cholesterol: 47 mg/dL (ref 0–99)
Total CHOL/HDL Ratio: 3.2 RATIO
Triglycerides: 71 mg/dL (ref <150)
VLDL: 14 mg/dL (ref 0–40)

## 2013-07-17 LAB — HEMOGLOBIN A1C
Hgb A1c MFr Bld: 5.2 % (ref <5.7)
MEAN PLASMA GLUCOSE: 103 mg/dL (ref <117)

## 2013-07-17 MED ORDER — PANTOPRAZOLE SODIUM 40 MG PO TBEC
40.0000 mg | DELAYED_RELEASE_TABLET | Freq: Every day | ORAL | Status: DC
  Administered 2013-07-17: 40 mg via ORAL
  Filled 2013-07-17: qty 1

## 2013-07-17 MED ORDER — ASPIRIN EC 81 MG PO TBEC
81.0000 mg | DELAYED_RELEASE_TABLET | Freq: Every day | ORAL | Status: DC
  Administered 2013-07-17: 81 mg via ORAL
  Filled 2013-07-17: qty 1

## 2013-07-17 MED ORDER — TRAMADOL HCL 50 MG PO TABS
100.0000 mg | ORAL_TABLET | Freq: Four times a day (QID) | ORAL | Status: DC | PRN
  Administered 2013-07-17: 100 mg via ORAL
  Filled 2013-07-17: qty 2

## 2013-07-17 MED ORDER — TRAMADOL HCL 50 MG PO TABS: 50.0000 mg | ORAL_TABLET | Freq: Four times a day (QID) | ORAL | Status: DC

## 2013-07-17 MED ORDER — ASPIRIN 81 MG PO CHEW: 81.0000 mg | CHEWABLE_TABLET | Freq: Every day | ORAL | Status: DC

## 2013-07-17 MED ORDER — ASPIRIN 81 MG PO CHEW: 81.0000 mg | CHEWABLE_TABLET | Freq: Every day | ORAL | Status: AC

## 2013-07-17 MED ORDER — TRAMADOL HCL 50 MG PO TABS: 50.0000 mg | ORAL_TABLET | Freq: Four times a day (QID) | ORAL | Status: AC | PRN

## 2013-07-17 NOTE — Progress Notes (Signed)
Pt back from MRI 

## 2013-07-17 NOTE — Progress Notes (Signed)
PT Cancellation Note  Patient Details Name: Brandy Galvan MRN: [id number] DOB: 09-Mar-1981   Cancelled Treatment:    Reason Eval/Treat Not Completed: Patient not medically ready (active bedrest orders, will follow for updated activity)   Duncan Dull 07/17/2013, 8:37 AM Alben Deeds, PT DPT  [phone]

## 2013-07-17 NOTE — Progress Notes (Signed)
OT Cancellation Note  Patient Details Name: Brandy Galvan MRN: [id number] DOB: 11-28-1980   Cancelled Treatment:    Reason Eval/Treat Not Completed: Other (comment). Pt seen by PT and is at an Independent to Mod I level for all mobility. Orders for pt to D/C today. No apparent OT needs, we will sign off.  Almon Register [phone] 07/17/2013, 4:49 PM

## 2013-07-17 NOTE — Progress Notes (Addendum)
Pt threatening to leave AMA. Pt then stated she would stay for awhile but would not stay the night. Pt still c/o headache, NP aware of both. Pt also ambulating around room and intermittently disconnecting self from monitor. Will continue to monitor.   Latrelle Dodrill

## 2013-07-17 NOTE — Progress Notes (Addendum)
Dr. Leonie Man aware of MRI results. Per Dr. Nicole Kindred, no activity or work restrictions.   Brandy Galvan

## 2013-07-17 NOTE — Progress Notes (Addendum)
Pt impatient during d/c process, removing leads, blood pressure cuff, and also d/c IV when RN not in room and per pt, discarded in sharps container and earlier had threatened AMA. RN explained to pt that RN was to remove IV. Pt was given AVS and stroke education including F/U appt, risk factors, stroke s/s, calling 911 in case of emergency, and prescription for Aspirin and Tramadol. Pt d/c home via wheelchair with friend.

## 2013-07-17 NOTE — Progress Notes (Signed)
Stroke Team Progress Note  HISTORY Brandy Galvan is an 33 y.o. female with a history of kidney cancer presenting following an episode of brief loss of consciousness at 8 AM on 07/16/2013 while at work. She noticed lightheadedness and slurred speech prior to losing consciousness and slurred speech and persisted on waking up. She also noted left-sided weakness involving upper and lower extremities. No history stroke or TIA. She had been on antiplatelet therapy. CT scan of the head showed no acute intracranial abnormality. NIH stroke score was 2 with drift of left extremities. Patient was not deemed a candidate for TPA because of the minimal deficits on exam. Slurred speech resolved prior to arriving in the emergency room. Patient was unconscious for only a few seconds and no seizure activity was reported. Mild left eyelid ptosis was noted. 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 CT angiogram of the neck showed ectatic appearance of the aortic arch. There was no great vessel stenosis. She was enrolled in the [facility] trial - treated with IV tPA vs placebo.  She was admitted to the neuro ICU for further evaluation and treatment.  SUBJECTIVE Her RN is at the bedside.  Overall she feels her condition is rapidly improving. No new complaints. Wants to go home  OBJECTIVE Most recent Vital Signs: Filed Vitals:   07/17/13 0356 07/17/13 0502 07/17/13 0613 07/17/13 0800  BP: 149/96 141/94 179/96 157/77  Pulse: 73 62  65  Temp:    98 F (36.7 C)  TempSrc:    Oral  Resp: 42 13 17 15   Height:      Weight:      SpO2: 94% 96%  99%   CBG (last 3)   Recent Labs  07/16/13 0931  GLUCAP 90    IV Fluid Intake:   . sodium chloride 75 mL/hr at 07/16/13 1900    MEDICATIONS  .  stroke: mapping our early stages of recovery book   Does not apply Once  . pantoprazole (PROTONIX) IV  40 mg Intravenous QHS   PRN:  acetaminophen, acetaminophen, HYDROmorphone (DILAUDID)  injection, labetalol, ondansetron (ZOFRAN) IV  Diet:  General thin liquids Activity:  Bedrest DVT Prophylaxis:  SCDs   CLINICALLY SIGNIFICANT STUDIES Basic Metabolic Panel:  Recent Labs Lab 07/16/13 0912 07/16/13 0919  NA 142 143  K 3.4* 3.2*  CL 106 104  CO2 23  --   GLUCOSE 75 75  BUN 12 10  CREATININE 0.71 0.70  CALCIUM 8.9  --    Liver Function Tests:  Recent Labs Lab 07/16/13 0912  AST 11  ALT 7  ALKPHOS 56  BILITOT 0.3  PROT 6.1  ALBUMIN 3.5   CBC:  Recent Labs Lab 07/16/13 0912 07/16/13 0919  WBC 7.4  --   NEUTROABS 4.3  --   HGB 12.4 12.9  HCT 37.0 38.0  MCV 92.5  --   PLT 231  --    Coagulation:  Recent Labs Lab 07/16/13 0912  LABPROT 14.2  INR 1.12   Cardiac Enzymes: No results found for this basename: CKTOTAL, CKMB, CKMBINDEX, TROPONINI,  in the last 168 hours Urinalysis:  Recent Labs Lab 07/16/13 1015  COLORURINE YELLOW  LABSPEC 1.014  PHURINE 7.5  GLUCOSEU NEGATIVE  HGBUR MODERATE*  BILIRUBINUR NEGATIVE  KETONESUR NEGATIVE  PROTEINUR NEGATIVE  UROBILINOGEN 0.2  NITRITE NEGATIVE  LEUKOCYTESUR NEGATIVE   Lipid Panel    Component Value Date/Time   CHOL 89 07/17/2013 0327  TRIG 71 07/17/2013 0327   HDL 28* 07/17/2013 0327   CHOLHDL 3.2 07/17/2013 0327   VLDL 14 07/17/2013 0327   LDLCALC 47 07/17/2013 0327   HgbA1C  No results found for this basename: HGBA1C    Urine Drug Screen:     Component Value Date/Time   LABOPIA NONE DETECTED 07/16/2013 1015   COCAINSCRNUR NONE DETECTED 07/16/2013 1015   LABBENZ NONE DETECTED 07/16/2013 1015   AMPHETMU NONE DETECTED 07/16/2013 1015   THCU NONE DETECTED 07/16/2013 1015   LABBARB NONE DETECTED 07/16/2013 1015    Alcohol Level:  Recent Labs Lab 07/16/13 0912  ETH <11    CT of the brain  07/16/2013   Negative exam.  Ct Angio Head & Neck   07/16/2013    1. Negative neck CTA except for mild ectasia of the visualized thoracic aorta. 2. Intracranial CTA positive for small right MCA bifurcation  aneurysm, 2-3 mm. No intracranial artery stenosis or major circle of Willis branch occlusion identified. 3. Stable and negative CT appearance of the brain.    MRI of the brain    MRA of the brain    2D Echocardiogram    Carotid Doppler  See CTA neck  CXR  07/16/2013    No active disease.     EKG  normal sinus rhythm. For complete results please see formal report.   Therapy Recommendations   Physical Exam  : Awake alert. Afebrile. Head is nontraumatic. Neck is supple without bruit. Hearing is normal. Cardiac exam no murmur or gallop. Lungs are clear to auscultation. Distal pulses are well felt. Neurological Exam ;  Awake  Alert oriented x 3. Normal speech and language.eye movements full without nystagmus.fundi were not visualized. Vision acuity and fields appear normal. Hearing is normal. Palatal movements are normal. Face symmetric. Tongue midline. Normal strength, tone, reflexes and coordination. Left hemibody decrease sensation. Gait deferred. ASSESSMENT Ms. Brandy Galvan is a 33 y.o. female presenting with an episode of loss of consciousness with associated slurred speech and left-sided weakness. She was enrolled in the [facility] trial - status post IV t-PA/placebo 07/16/2013 at 1041. Suspect a right brain stroke; imaging pending. On no antithrombotics prior to admission. Patient received aspirin/placebo 07/16/2013 at 1138. Now on no antithrombotics for secondary stroke prevention. Patient with resultant mild left heminumbness.Stroke work up underway.  hypertension   Kidney cancer s/p partial left nephrectomy 2012  Migraines  Cigarette smoker  etoh use  LDL 47   Hypokalemia, 3.2  Hospital day # 1  TREATMENT/PLAN  Add aspirin 81 mg orally every day for secondary stroke prevention.if head Ct negative for bleed at 24 hrs  Complete stroke workup:  MRI, 2D, HgbA1c. Cancel carotid dopplers and MRA  Mobilize out of bed  Smoking cessation counselling  I have personally obtained  a history, examined the patient, evaluated imaging results, and formulated the assessment and plan of care. I agree with the above. This patient is critically ill and at significant risk of neurological worsening, death and care requires constant monitoring of vital signs, hemodynamics,respiratory and cardiac monitoring,review of multiple databases, neurological assessment, discussion with family, other specialists and medical decision making of high complexity. I spent 30 minutes of neurocritical care time  in the care of  this patient. Antony Contras, MD   To contact Stroke Continuity provider, please refer to [email or web address] After hours, contact General Neurology

## 2013-07-17 NOTE — Progress Notes (Signed)
Echocardiogram 2D Echocardiogram has been performed.  Brandy Galvan 07/17/2013, 11:26 AM

## 2013-07-17 NOTE — Evaluation (Addendum)
Physical Therapy Evaluation Patient Details Name: Brandy Galvan MRN: [id number] DOB: October 19, 1980 Today's Date: 07/17/2013   History of Present Illness  33 y.o. female presenting following an episode of loss of consciousness, as well as persistent mild weakness of left upper and lower extremities. MRI pending.  Clinical Impression  Patient independent with ambulation and mobility at this time. Performed stair negotiation without assist, No further acute PT needs, patient in agreement. OF NOTE: Patient did report mild dizziness during activity but resolved quickly with rest. BP was elevated but did decrease with rest as well. (152/93 prior to activity, 170/114 with activity, 158/98 after rest)    Follow Up Recommendations No PT follow up    Equipment Recommendations  None recommended by PT    Recommendations for Other Services       Precautions / Restrictions Restrictions Weight Bearing Restrictions: No      Mobility  Bed Mobility Overal bed mobility: Independent                Transfers Overall transfer level: Independent                  Ambulation/Gait Ambulation/Gait assistance: Independent Ambulation Distance (Feet): 240 Feet Assistive device: None Gait Pattern/deviations: WFL(Within Functional Limits) (modest left functional deviaiton, decreased dorsiflexion) Gait velocity: WFL Gait velocity interpretation: at or above normal speed for age/gender General Gait Details: steady with ambulation  Stairs Stairs: Yes Stairs assistance: Modified independent (Device/Increase time) Stair Management: One rail Right;Alternating pattern Number of Stairs: 9 General stair comments: Some modest dizziness set in with stair negotiation, subsided with rest  Wheelchair Mobility    Modified Rankin (Stroke Patients Only)       Balance Overall balance assessment: No apparent balance deficits (not formally assessed)                                            Pertinent Vitals/Pain (152/93 prior to activity, 170/114 with activity, 158/98 after rest)    Home Living Family/patient expects to be discharged to:: Private residence Living Arrangements: Parent Available Help at Discharge: Family Type of Home: House Home Access: Stairs to enter Entrance Stairs-Rails: None Technical brewer of Steps: 2 Home Layout: Two level;Bed/bath upstairs Home Equipment: None Additional Comments: tub shower with curtain    Prior Function Level of Independence: Independent               Hand Dominance   Dominant Hand: Right    Extremity/Trunk Assessment   Upper Extremity Assessment: Defer to OT evaluation           Lower Extremity Assessment: LLE deficits/detail   LLE Deficits / Details: weakness noted upon testing 3+/5 functional but easy to break     Communication   Communication: No difficulties  Cognition Arousal/Alertness: Awake/alert Behavior During Therapy: WFL for tasks assessed/performed Overall Cognitive Status: Within Functional Limits for tasks assessed                      General Comments      Exercises        Assessment/Plan    PT Assessment Patent does not need any further PT services  PT Diagnosis     PT Problem List    PT Treatment Interventions     PT Goals (Current goals can be found in the Care Plan section) Acute Rehab PT Goals  PT Goal Formulation: No goals set, d/c therapy    Frequency     Barriers to discharge        Co-evaluation               End of Session Equipment Utilized During Treatment: Gait belt Activity Tolerance: Patient tolerated treatment well Patient left: in bed;with call bell/phone within reach Nurse Communication: Mobility status         Time: 1001-1018 PT Time Calculation (min): 17 min   Charges:   PT Evaluation $Initial PT Evaluation Tier I: 1 Procedure PT Treatments $Gait Training: 8-22 mins   PT G Codes:          Duncan Dull 07/17/2013, 11:16 AM Alben Deeds, PT DPT  [phone]

## 2013-07-17 NOTE — Progress Notes (Signed)
Pt taken to MRI via transport.

## 2013-07-17 NOTE — Discharge Instructions (Addendum)
Ischemic Stroke A stroke (cerebrovascular accident) is the sudden death of brain tissue. It is a medical emergency. A stroke can cause permanent loss of brain function. This can cause problems with different parts of your body. A transient ischemic attack (TIA) is different because it does not cause permanent damage. A TIA is a short-lived problem of poor blood flow affecting a part of the brain. A TIA is also a serious problem because having a TIA greatly increases the chances of having a stroke. When symptoms first develop, you cannot know if the problem might be a stroke or TIA. CAUSES  A stroke is caused by a decrease of oxygen supply to an area of your brain. It is usually the result of a small blood clot or collection of cholesterol or fat (plaque) that blocks blood flow in the brain. A stroke can also be caused by blocked or damaged carotid arteries.  RISK FACTORS  High blood pressure (hypertension).  High cholesterol.  Diabetes mellitus.  Heart disease.  The build up of plaque in the blood vessels (peripheral artery disease or atherosclerosis).  The build up of plaque in the blood vessels providing blood and oxygen to the brain (carotid artery stenosis).  An abnormal heart rhythm (atrial fibrillation).  Obesity.  Smoking.  Taking oral contraceptives (especially in combination with smoking).  Physical inactivity.  A diet high in fats, salt (sodium), and calories.  Alcohol use.  Use of illegal drugs (especially cocaine and methamphetamine).  Being African American.  Being over the age of 55.  Family history of stroke.  Previous history of blood clots, stroke, TIA, or heart attack.  Sickle cell disease. SYMPTOMS  These symptoms usually develop suddenly, or may be newly present upon awakening from sleep:  Sudden weakness or numbness of the face, arm, or leg, especially on one side of the body.  Sudden trouble walking or difficulty moving arms or legs.  Sudden  confusion.  Sudden personality changes.  Trouble speaking (aphasia) or understanding.  Difficulty swallowing.  Sudden trouble seeing in one or both eyes.  Double vision.  Dizziness.  Loss of balance or coordination.  Sudden severe headache with no known cause.  Trouble reading or writing. DIAGNOSIS  Your caregiver can often determine the presence or absence of a stroke based on your symptoms, history, and physical exam. Computed tomography (CT) of the brain is usually performed to confirm the stroke, determine causes, and determine stroke severity. Other tests may be done to find the cause of the stroke. These tests may include:  Electrocardiography.  Continuous heart monitoring.  Echocardiography.  Carotid ultrasonography.  Magnetic resonance imaging (MRI).  A scan of the brain circulation.  Blood tests. PREVENTION  The risk of a stroke can be decreased by appropriately treating high blood pressure, high cholesterol, diabetes, heart disease, and obesity and by quitting smoking, limiting alcohol, and staying physically active. TREATMENT  Time is of the essence. It is important to seek treatment within 3 4 hours of the start of symptoms because you may receive a medicine to dissolve the clot (thrombolytic) that cannot be given after that time. Even if you do not know when your symptoms began, get treatment as soon as possible. After the 4 hour window has passed, treatment may include rest, oxygen, intravenous (IV) fluids, and medicines to thin the blood (anticoagulants). Treatment of stroke depends on the duration, severity, and cause of your symptoms. Medicines and diet may be used to address diabetes, high blood pressure, and other risk   factors. Physical, speech, and occupational therapists will assess you and work to improve any functions impaired by the stroke. Measures will be taken to prevent short-term and long-term complications, including infection from breathing  foreign material into the lungs (aspiration pneumonia), blood clots in the legs, bedsores, and falls. Rarely, surgery may be needed to remove large blood clots or to open up blocked arteries. HOME CARE INSTRUCTIONS   Take all medicines prescribed by your caregiver. Follow the directions carefully. Medicines may be used to control risk factors for a stroke. Be sure you understand all your medicine instructions.  You may be told to take aspirin or the anticoagulant warfarin. Warfarin needs to be taken exactly as instructed.  Too much and too little warfarin are both dangerous. Too much warfarin increases the risk of bleeding. Too little warfarin continues to allow the risk for blood clots. While taking warfarin, you will need to have regular blood tests to measure your blood clotting time. These blood tests usually include both the PT and INR tests. The PT and INR results allow your caregiver to adjust your dose of warfarin. The dose can change for many reasons. It is critically important that you take warfarin exactly as prescribed, and that you have your PT and INR levels drawn exactly as directed.  Many foods, especially foods high in vitamin K can interfere with warfarin and affect the PT and INR results. Foods high in vitamin K include spinach, kale, broccoli, cabbage, collard and turnip greens, brussels sprouts, peas, cauliflower, seaweed, and parsley as well as beef and pork liver, green tea, and soybean oil. You should eat a consistent amount of foods high in vitamin K. Avoid major changes in your diet, or notify your caregiver before changing your diet. Arrange a visit with a dietitian to answer your questions.  Many medicines can interfere with warfarin and affect the PT and INR results. You must tell your caregiver about any and all medicines you take, this includes all vitamins and supplements. Be especially cautious with aspirin and anti-inflammatory medicines. Do not take or discontinue any  prescribed or over-the-counter medicine except on the advice of your caregiver or pharmacist.  Warfarin can have side effects, such as excessive bruising or bleeding. You will need to hold pressure over cuts for longer than usual. Your caregiver or pharmacist will discuss other potential side effects.  Avoid sports or activities that may cause injury or bleeding.  Be mindful when shaving, flossing your teeth, or handling sharp objects.  Alcohol can change the body's ability to handle warfarin. It is best to avoid alcoholic drinks or consume only very small amounts while taking warfarin. Notify your caregiver if you change your alcohol intake.  Notify your dentist or other caregivers before procedures.  If swallow studies have determined that your swallowing reflex is present, you should eat healthy foods. A diet that includes 5 or more servings of fruits and vegetables a day may reduce the risk of stroke. Foods may need to be a special consistency (soft or pureed), or small bites may need to be taken in order to avoid aspirating or choking. Certain diets may be prescribed to address high blood pressure, high cholesterol, diabetes, or obesity.  A low-sodium, low-saturated fat, low-trans fat, low-cholesterol diet is recommended to manage high blood pressure.  A low-saturated fat, low-trans fat, low-cholesterol, and high-fiber diet may control cholesterol levels.  A controlled-carbohydrate, controlled-sugar diet is recommended to manage diabetes.  A reduced-calorie, low-sodium, low-saturated fat, low-trans fat, low-cholesterol diet   is recommended to manage obesity.  Maintain a healthy weight.  Stay physically active. It is recommended that you get at least 30 minutes of activity on most or all days.  Do not smoke.  Limit alcohol use even if you are not taking warfarin. Moderate alcohol use is considered to be:  No more than 2 drinks each day for men.  No more than 1 drink each day for  nonpregnant women.  Stop drug abuse.  Home safety. A safe home environment is important to reduce the risk of falls. Your caregiver may arrange for specialists to evaluate your home. Having grab bars in the bedroom and bathroom is often important. Your caregiver may arrange for equipment to be used at home, such as raised toilets and a seat for the shower.  Physical, occupational, and speech therapy. Ongoing therapy may be needed to maximize your recovery after a stroke. If you have been advised to use a walker or a cane, use it at all times. Be sure to keep your therapy appointments.  Follow all instructions for follow-up with your caregiver. This is very important. This includes any referrals, physical therapy, rehabilitation, and lab tests. Proper follow up can prevent another stroke from occurring. SEEK MEDICAL CARE IF:  You have personality changes.  You have difficulty swallowing.  You are seeing double.  You have dizziness.  You have a fever.  You have skin breakdown. SEEK IMMEDIATE MEDICAL CARE IF:  Any of these symptoms may represent a serious problem that is an emergency. Do not wait to see if the symptoms will go away. Get medical help right away. Call your local emergency services (911 in [place] Do not drive yourself to the hospital.  You have sudden weakness or numbness of the face, arm, or leg, especially on one side of the body.  You have sudden trouble walking or difficulty moving arms or legs.  You have sudden confusion.  You have trouble speaking (aphasia) or understanding.  You have sudden trouble seeing in one or both eyes.  You have a loss of balance or coordination.  You have a sudden, severe headache with no known cause.  You have new chest pain or an irregular heartbeat.  You have a partial or total loss of consciousness.   Document Released: 03/05/2005 Document Revised: 11/05/2012 Document Reviewed: 10/14/2011 [employer] Patient Information 2014  [employer]  

## 2013-07-17 NOTE — Discharge Summary (Signed)
Physician Discharge Summary  Patient ID: Brandy Galvan MRN: [id number] DOB/AGE: 04-25-1980 33 y.o.  Admit date: 07/16/2013 Discharge date: 07/17/2013  Admission Diagnoses: Code Stroke  Discharge Diagnoses: Small Brainstem stroke not seen on MRI  Patient enrolled in [employer]IS[employer] trial ( iv TPA versus placebo ) with good clinical recovery. Active Problems:   Syncope   Left-sided weakness   CVA (cerebral infarction)   Aneurysm, cerebral, nonruptured   Discharged Condition: good  Hospital Course: Tifani Dack is an 33 y.o. female with a history of kidney cancer presenting following an episode of brief loss of consciousness at 8 AM on 07/16/2013 while at work. She noticed lightheadedness and slurred speech prior to losing consciousness and slurred speech and persisted on waking up. She also noted left-sided weakness involving upper and lower extremities. No history stroke or TIA. She had been on antiplatelet therapy. CT scan of the head showed no acute intracranial abnormality. NIH stroke score was 2 with drift of left extremities. Patient was not deemed a candidate for TPA because of the minimal deficits on exam. Slurred speech resolved prior to arriving in the emergency room. Patient was unconscious for only a few seconds and no seizure activity was reported. Mild left eyelid ptosis was noted. 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 CT angiogram of the neck showed ectatic appearance of the aortic arch. There was no great vessel stenosis. She was enrolled in the [facility] trial - treated with IV tPA vs placebo. She was admitted to the neuro ICU for further evaluation and treatment. Her symptoms resolved shortly after admission. She felt her dizziness was improved and left-sided strength was back to normal. Left-sided numbness was also nearly resolved at the time of discharge. 24 CT head showed no acute hemorrhage. The patient had MRI scan of the brain which showed  no acute infarct. 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 Lipid profile and hemoglobin A1c were normal .Transthoracic echocardiogram was performed but results weren't tolerable at the time of discharge. The patient did not want to wait for the results and she stated that she wanted to be home before the weekend in order to be with her children whom she gets to see only occasionally. She threatened to leave [place] if she was not discharged and hence she was discharged before the echo parameters were available. She was advised to call my office to get the echocardiogram results next week. She was advised to get a primary care physician to follow him for medical followup. She is advised to follow up with me in my office in 2 months. She was advised about a small asymptomatic cerebral aneurysm requiring surveillance and repeat CT angiogram at one-year intervals. Physical therapy had seen the patient and cleared her but occupational therapy consult was pending the patient did not want to wait till the consult was completed .   Consults: Physical therapy.  Significant Diagnostic Studies: labs: BMP.CBS,lipids, HbA1c and  Ct angiography: brain and neck, MRI brain and Transthoraxic echo  Treatments:  none  Discharge Exam: Awake alert oriented x3 with normal speech and language function. Extraocular movements full range without nystagmus. Vision acuity and fields are normal. Face is symmetric without weakness. Tongue is midline. Motor system exam no upper or lower extremity drift. No focal weakness. Mild subjective decrease sensation on the left hemibody with splitting of midline. Gait is steady. Blood pressure 165/118, pulse 78, temperature  104 F (36.7 C), temperature source Oral, resp. rate 19, height 5\' 3"  (1.6 m), weight 123 lb 3.2 oz (55.883 kg), last menstrual period 06/27/2013, SpO2 98.00%.    Disposition:Home     Medication  List    Notice   Aspirin 81 mg daily Tramadol 50 mg every 6 hourly as needed for headache         Follow-up Information   Follow up with Forbes Cellar, MD In 2 months. (stroke f/u)    Specialties:  Neurology, Radiology   Contact information:   [place] [id number] [phone]       Signed: Garvin Fila 07/17/2013, 4:12 PM

## 2013-07-17 NOTE — Progress Notes (Signed)
SLP Cancellation Note  Patient Details Name: Brandy Galvan MRN: [id number] DOB: [birth year]   Cancelled treatment:       Reason Eval/Treat Not Completed: SLP screened, no needs identified, will sign off   Katherene Ponto Caterra Ostroff 07/17/2013, 1:24 PM

## 2013-08-12 ENCOUNTER — Telehealth: Payer: Self-pay | Admitting: *Deleted

## 2013-08-17 NOTE — Telephone Encounter (Signed)
Someone may have called the patient regarding a stroke f/u i will return her call and make a stroke f/u with Dr. Erlinda Hong

## 2015-12-22 IMAGING — CT CT HEAD W/O CM
2 series · 16 of 30 positions shown, 20 images · non-contrast
Comparison: 07/16/2013 CT can CT angiography.

CLINICAL DATA: Acute left-sided weakness and loss of consciousness.

EXAM:
CT HEAD WITHOUT CONTRAST
TECHNIQUE: Contiguous axial images were obtained from the base of the skull
through the vertex without intravenous contrast.

[Series 201: head w/o, idose (1) · axial · non-contrast · 0.43mm/px · z∈[+98,+218]mm · 13 of 30 slices shown, 17 images]
[im 3/30  brain]
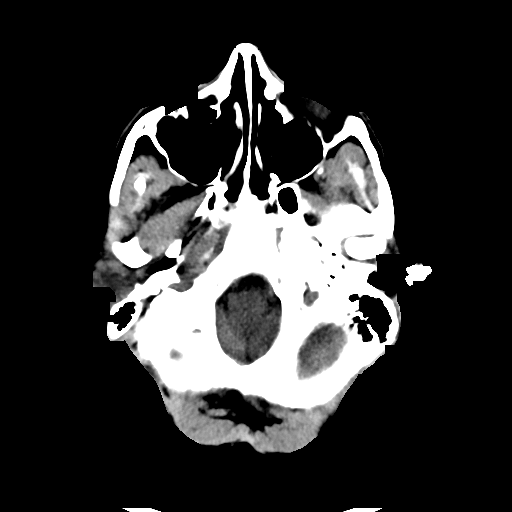
[im 3/30  bone]
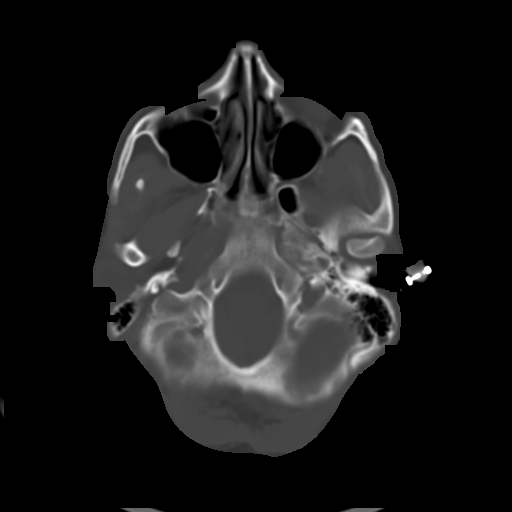
[im 5/30  brain]
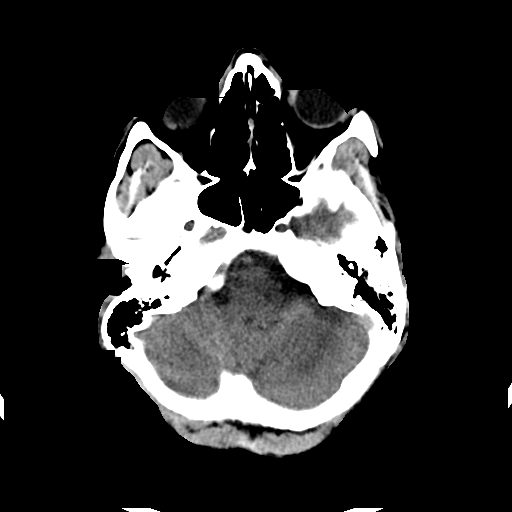
[im 7/30  brain]
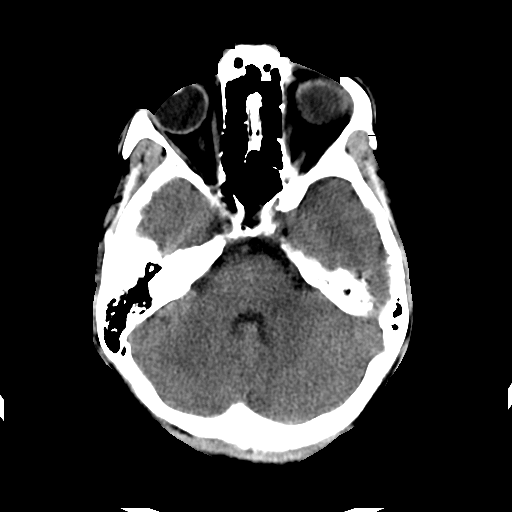
[im 9/30  brain]
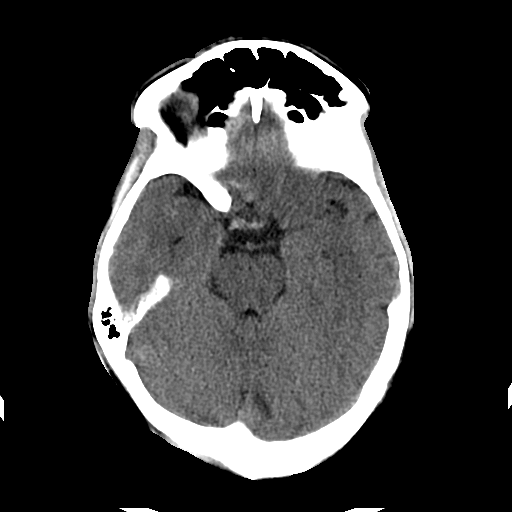
[im 11/30  brain]
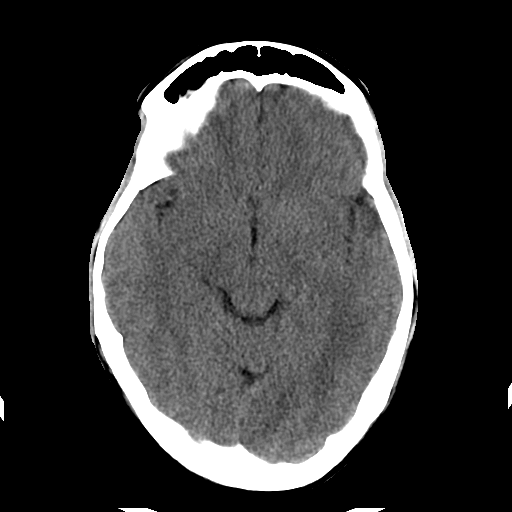
[im 11/30  bone]
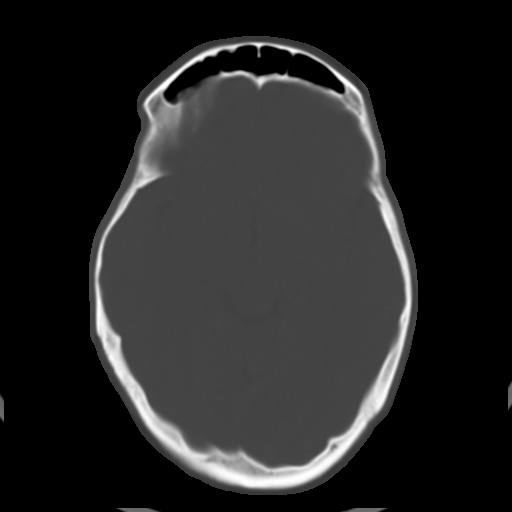
[im 13/30  brain]
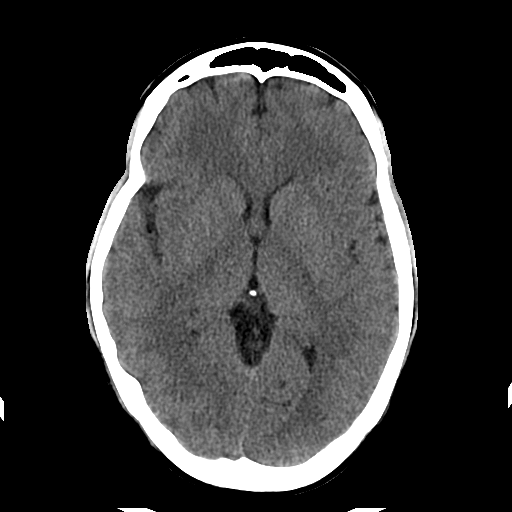
[im 15/30  brain]
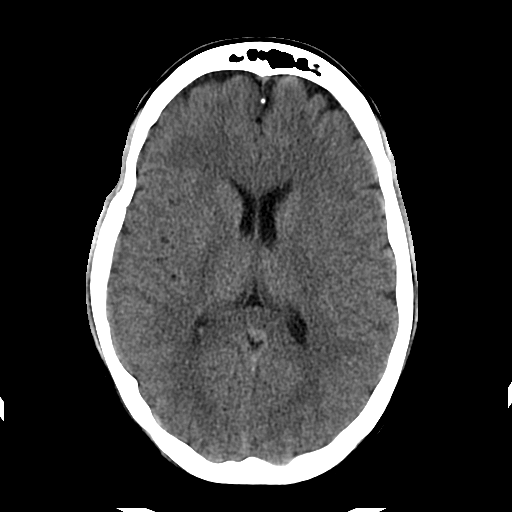
[im 17/30  brain]
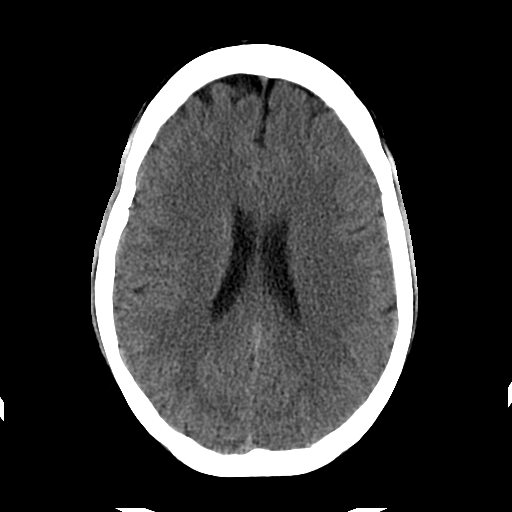
[im 19/30  brain]
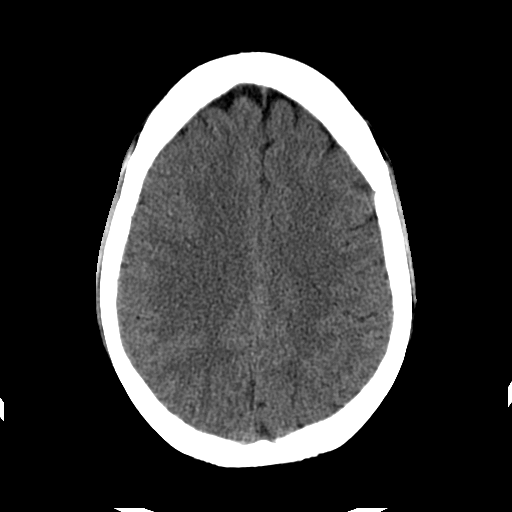
[im 19/30  bone]
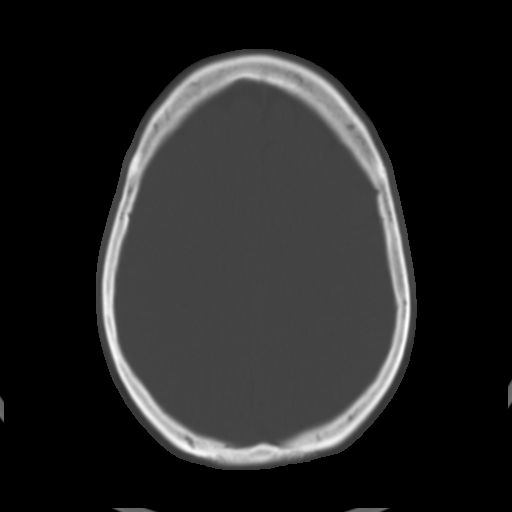
[im 21/30  brain]
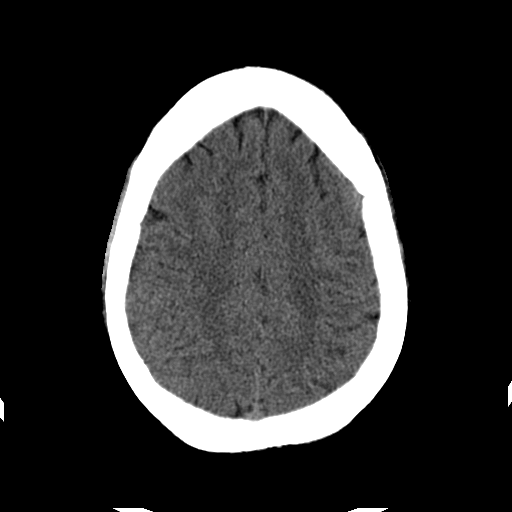
[im 23/30  brain]
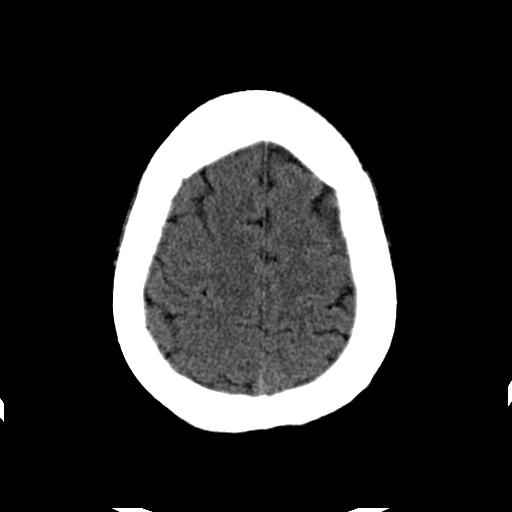
[im 25/30  brain]
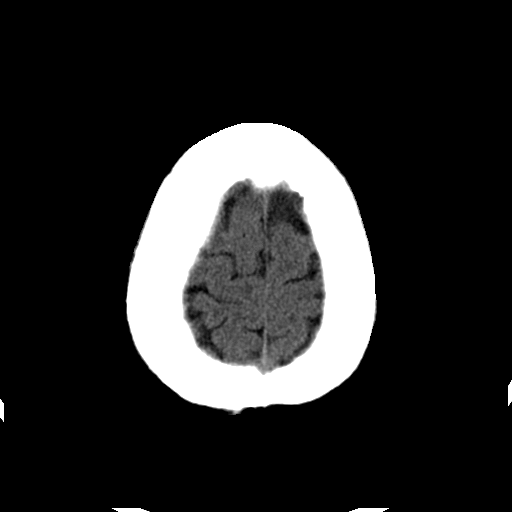
[im 27/30  brain]
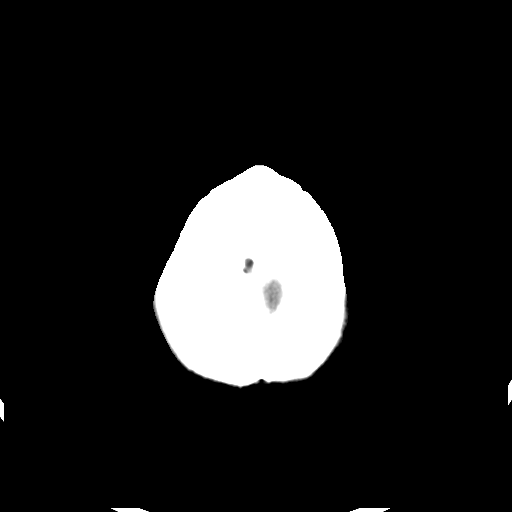
[im 27/30  bone]
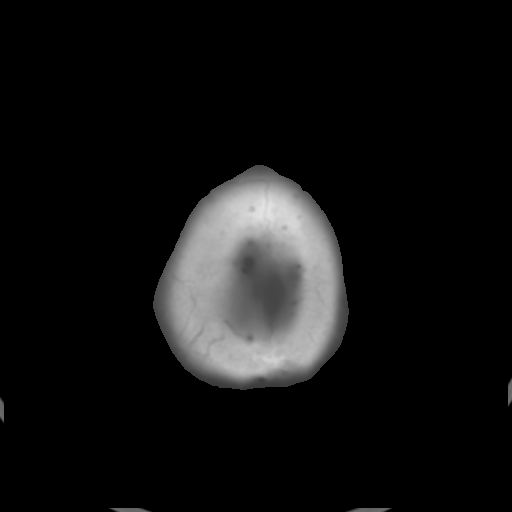

[Series 202: head w/o bone, idose (1) · axial · non-contrast · 0.43mm/px · z∈[+98,+138]mm · 3 of 30 slices shown]
[im 3/30  bone]
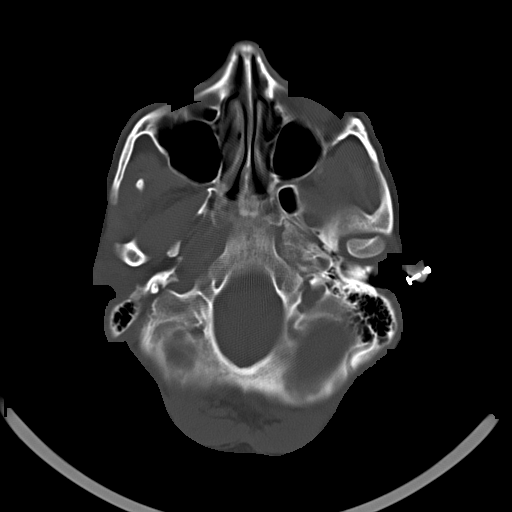
[im 7/30  bone]
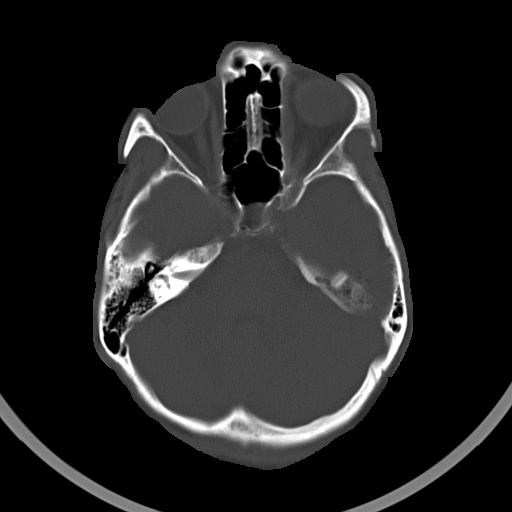
[im 11/30  bone]
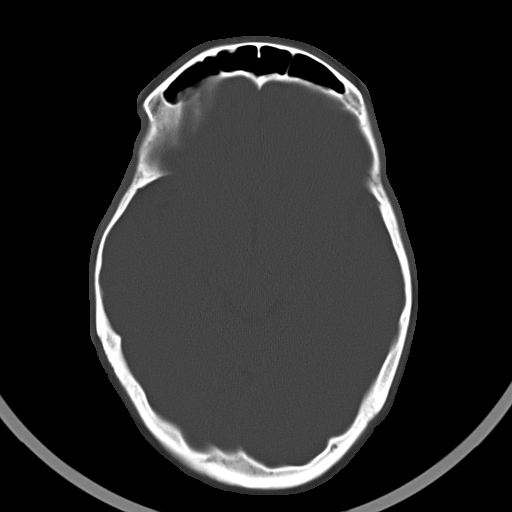

[16 of 30 positions shown; findings below may reference images not displayed]

FINDINGS: The brain continues to have a normal appearance without evidence of
old or acute infarction, mass lesion, hemorrhage, hydrocephalus or
extra-axial collection. No calvarial abnormality. Sinuses remain
clear.
IMPRESSION: Normal head CT.  No change since yesterday
